# Patient Record
Sex: Female | Born: 1965 | Race: Black or African American | Hispanic: No | Marital: Single | Smoking: Current every day smoker
Health system: Southern US, Community
[De-identification: ages and names within clinical notes are randomized; demographics above are authoritative.]

## PROBLEM LIST (undated history)

## (undated) DIAGNOSIS — E079 Disorder of thyroid, unspecified: Secondary | ICD-10-CM

## (undated) DIAGNOSIS — F329 Major depressive disorder, single episode, unspecified: Secondary | ICD-10-CM

## (undated) DIAGNOSIS — M1990 Unspecified osteoarthritis, unspecified site: Secondary | ICD-10-CM

## (undated) DIAGNOSIS — F32A Depression, unspecified: Secondary | ICD-10-CM

## (undated) DIAGNOSIS — E0500 Thyrotoxicosis with diffuse goiter without thyrotoxic crisis or storm: Secondary | ICD-10-CM

## (undated) DIAGNOSIS — E7800 Pure hypercholesterolemia, unspecified: Secondary | ICD-10-CM

## (undated) DIAGNOSIS — F419 Anxiety disorder, unspecified: Secondary | ICD-10-CM

## (undated) HISTORY — PX: BREAST SURGERY: SHX581

## (undated) HISTORY — PX: BREAST EXCISIONAL BIOPSY: SUR124

---

## 2013-05-29 HISTORY — PX: BREAST BIOPSY: SHX20

## 2016-05-29 HISTORY — PX: REDUCTION MAMMAPLASTY: SUR839

## 2017-04-26 DIAGNOSIS — E118 Type 2 diabetes mellitus with unspecified complications: Secondary | ICD-10-CM | POA: Diagnosis not present

## 2017-04-26 DIAGNOSIS — G894 Chronic pain syndrome: Secondary | ICD-10-CM | POA: Diagnosis not present

## 2017-04-26 DIAGNOSIS — F330 Major depressive disorder, recurrent, mild: Secondary | ICD-10-CM | POA: Diagnosis not present

## 2017-04-26 DIAGNOSIS — M810 Age-related osteoporosis without current pathological fracture: Secondary | ICD-10-CM | POA: Diagnosis not present

## 2017-04-26 DIAGNOSIS — M818 Other osteoporosis without current pathological fracture: Secondary | ICD-10-CM | POA: Diagnosis not present

## 2017-05-10 DIAGNOSIS — M545 Low back pain: Secondary | ICD-10-CM | POA: Diagnosis not present

## 2017-05-31 DIAGNOSIS — M47817 Spondylosis without myelopathy or radiculopathy, lumbosacral region: Secondary | ICD-10-CM | POA: Diagnosis not present

## 2017-05-31 DIAGNOSIS — Z79891 Long term (current) use of opiate analgesic: Secondary | ICD-10-CM | POA: Diagnosis not present

## 2017-05-31 DIAGNOSIS — G894 Chronic pain syndrome: Secondary | ICD-10-CM | POA: Diagnosis not present

## 2017-05-31 DIAGNOSIS — M4316 Spondylolisthesis, lumbar region: Secondary | ICD-10-CM | POA: Diagnosis not present

## 2017-05-31 DIAGNOSIS — Z79899 Other long term (current) drug therapy: Secondary | ICD-10-CM | POA: Diagnosis not present

## 2017-06-07 DIAGNOSIS — H524 Presbyopia: Secondary | ICD-10-CM | POA: Diagnosis not present

## 2017-06-14 DIAGNOSIS — F330 Major depressive disorder, recurrent, mild: Secondary | ICD-10-CM | POA: Diagnosis not present

## 2017-06-14 DIAGNOSIS — M545 Low back pain: Secondary | ICD-10-CM | POA: Diagnosis not present

## 2017-06-14 DIAGNOSIS — E079 Disorder of thyroid, unspecified: Secondary | ICD-10-CM | POA: Diagnosis not present

## 2017-06-14 DIAGNOSIS — M818 Other osteoporosis without current pathological fracture: Secondary | ICD-10-CM | POA: Diagnosis not present

## 2017-06-14 DIAGNOSIS — G894 Chronic pain syndrome: Secondary | ICD-10-CM | POA: Diagnosis not present

## 2017-07-17 DIAGNOSIS — H66001 Acute suppurative otitis media without spontaneous rupture of ear drum, right ear: Secondary | ICD-10-CM | POA: Diagnosis not present

## 2017-07-17 DIAGNOSIS — F329 Major depressive disorder, single episode, unspecified: Secondary | ICD-10-CM | POA: Diagnosis not present

## 2017-07-17 DIAGNOSIS — F17200 Nicotine dependence, unspecified, uncomplicated: Secondary | ICD-10-CM | POA: Diagnosis not present

## 2017-07-17 DIAGNOSIS — F419 Anxiety disorder, unspecified: Secondary | ICD-10-CM | POA: Diagnosis not present

## 2017-07-17 DIAGNOSIS — H9211 Otorrhea, right ear: Secondary | ICD-10-CM | POA: Diagnosis not present

## 2017-09-09 ENCOUNTER — Emergency Department (HOSPITAL_COMMUNITY)
Admission: EM | Admit: 2017-09-09 | Discharge: 2017-09-10 | Disposition: A | Discharge status: Home / Self Care | Payer: Medicare HMO | Attending: Emergency Medicine | Admitting: Emergency Medicine

## 2017-09-09 ENCOUNTER — Encounter (HOSPITAL_COMMUNITY): Payer: Self-pay | Admitting: Emergency Medicine

## 2017-09-09 ENCOUNTER — Emergency Department (HOSPITAL_COMMUNITY): Payer: Medicare HMO

## 2017-09-09 DIAGNOSIS — R0602 Shortness of breath: Secondary | ICD-10-CM | POA: Diagnosis not present

## 2017-09-09 DIAGNOSIS — E079 Disorder of thyroid, unspecified: Secondary | ICD-10-CM | POA: Insufficient documentation

## 2017-09-09 DIAGNOSIS — F17210 Nicotine dependence, cigarettes, uncomplicated: Secondary | ICD-10-CM | POA: Diagnosis not present

## 2017-09-09 DIAGNOSIS — R079 Chest pain, unspecified: Secondary | ICD-10-CM | POA: Insufficient documentation

## 2017-09-09 DIAGNOSIS — R072 Precordial pain: Secondary | ICD-10-CM | POA: Diagnosis not present

## 2017-09-09 HISTORY — DX: Disorder of thyroid, unspecified: E07.9

## 2017-09-09 HISTORY — DX: Pure hypercholesterolemia, unspecified: E78.00

## 2017-09-09 HISTORY — DX: Thyrotoxicosis with diffuse goiter without thyrotoxic crisis or storm: E05.00

## 2017-09-09 HISTORY — DX: Unspecified osteoarthritis, unspecified site: M19.90

## 2017-09-09 LAB — CBC
HCT: 42.6 % (ref 36.0–46.0)
HEMOGLOBIN: 13.9 g/dL (ref 12.0–15.0)
MCH: 28.7 pg (ref 26.0–34.0)
MCHC: 32.6 g/dL (ref 30.0–36.0)
MCV: 88 fL (ref 78.0–100.0)
Platelets: 207 10*3/uL (ref 150–400)
RBC: 4.84 MIL/uL (ref 3.87–5.11)
RDW: 13.9 % (ref 11.5–15.5)
WBC: 9.9 10*3/uL (ref 4.0–10.5)

## 2017-09-09 LAB — BASIC METABOLIC PANEL
ANION GAP: 8 (ref 5–15)
BUN: 10 mg/dL (ref 6–20)
CALCIUM: 9 mg/dL (ref 8.9–10.3)
CO2: 27 mmol/L (ref 22–32)
CREATININE: 0.76 mg/dL (ref 0.44–1.00)
Chloride: 106 mmol/L (ref 101–111)
GFR calc Af Amer: >60 mL/min (ref >60)
GLUCOSE: 113 mg/dL — AB (ref 65–99)
Potassium: 3.6 mmol/L (ref 3.5–5.1)
Sodium: 141 mmol/L (ref 135–145)

## 2017-09-09 LAB — I-STAT TROPONIN, ED: TROPONIN I, POC: 0 ng/mL (ref 0.00–0.08)

## 2017-09-09 LAB — I-STAT BETA HCG BLOOD, ED (MC, WL, AP ONLY): I-stat hCG, quantitative: <5 m[IU]/mL (ref <5)

## 2017-09-09 NOTE — ED Triage Notes (Signed)
Patient presents to ED for assessment of intermittent chest, jaw, neck and back pain x 2 months.  Patient under a lot of stress due to the death of her son in July last year.  Patient states dizziness with standing, SOB with exertion, denies nausea.

## 2017-09-10 ENCOUNTER — Emergency Department (HOSPITAL_COMMUNITY): Payer: Medicare HMO

## 2017-09-10 DIAGNOSIS — R0602 Shortness of breath: Secondary | ICD-10-CM | POA: Diagnosis not present

## 2017-09-10 DIAGNOSIS — R079 Chest pain, unspecified: Secondary | ICD-10-CM | POA: Diagnosis not present

## 2017-09-10 MED ORDER — LORAZEPAM 2 MG/ML IJ SOLN
1.0000 mg | Freq: Once | INTRAMUSCULAR | Status: AC
  Administered 2017-09-10: 1 mg via INTRAVENOUS
  Filled 2017-09-10: qty 1

## 2017-09-10 MED ORDER — IOPAMIDOL (ISOVUE-370) INJECTION 76%
INTRAVENOUS | Status: AC
  Filled 2017-09-10: qty 100

## 2017-09-10 MED ORDER — IOPAMIDOL (ISOVUE-370) INJECTION 76%
100.0000 mL | Freq: Once | INTRAVENOUS | Status: AC | PRN
  Administered 2017-09-10: 100 mL via INTRAVENOUS

## 2017-09-10 NOTE — Discharge Instructions (Addendum)
Follow-up with cardiology to discuss a stress test.  [facility] has been provided in this discharge summary for you to call and make these arrangements.  Return to the emergency department in the meantime if your symptoms significantly worsen or change.

## 2017-09-10 NOTE — ED Provider Notes (Signed)
[facility] EMERGENCY DEPARTMENT Provider Note   CSN: [id number] Arrival date & time: 09/09/17  2253     History   Chief Complaint Chief Complaint  Patient presents with  . Chest Pain    HPI Maria Gray is a 52 y.o. female.  Patient is a 52 year old female with past medical history of Graves' disease and hypercholesterolemia.  She presents today for evaluation of chest discomfort.  This is been ongoing since the end of February.  She reports intermittent pains throughout her chest that are worse with breathing.  She denies any nausea, diaphoresis, or radiation to the arm or jaw.  She denies any exertional symptoms.  She does report that her son was murdered last summer and this has been extremely stressful.  Patient denies any prior cardiac history.  She quit smoking right about the time of onset of her chest symptoms.  The history is provided by the patient.  Chest Pain   This is a new problem. Episode onset: the end of February. Episode frequency: Intermittent. The problem has been gradually worsening. The pain is present in the substernal region and lateral region. The pain is moderate. The quality of the pain is described as sharp and stabbing. The pain does not radiate. Pertinent negatives include no cough, no numbness, no palpitations and no shortness of breath. She has tried nothing for the symptoms.    Past Medical History:  Diagnosis Date  . Arthritis   . Graves disease   . Hypercholesteremia   . Thyroid disease    low    There are no active problems to display for this patient.      OB History   None      Home Medications    Prior to Admission medications   Not on File    Family History No family history on file.  Social History Social History   Tobacco Use  . Smoking status: Current Every Day Smoker    Packs/day: 0.50  . Smokeless tobacco: Never Used  Substance Use Topics  . Alcohol use: Yes    Comment: very rare    . Drug use: Yes    Types: Marijuana     Allergies   Patient has no allergy information on record.   Review of Systems Review of Systems  Respiratory: Negative for cough and shortness of breath.   Cardiovascular: Positive for chest pain. Negative for palpitations.  Neurological: Negative for numbness.  All other systems reviewed and are negative.    Physical Exam Updated Vital Signs BP 124/80 (BP Location: Left Arm)   Pulse 91   Temp 98.1 F (36.7 C)   Resp 18   Ht 5\' 2"  (1.575 m)   Wt 56.7 kg (125 lb)   SpO2 98%   BMI 22.86 kg/m   Physical Exam  Constitutional: She is oriented to person, place, and time. She appears well-developed and well-nourished. No distress.  HENT:  Head: Normocephalic and atraumatic.  Neck: Normal range of motion. Neck supple.  Cardiovascular: Normal rate and regular rhythm. Exam reveals no gallop and no friction rub.  No murmur heard. Pulmonary/Chest: Effort normal and breath sounds normal. No respiratory distress. She has no wheezes.  Abdominal: Soft. Bowel sounds are normal. She exhibits no distension. There is no tenderness.  Musculoskeletal: Normal range of motion.       Right lower leg: Normal. She exhibits no tenderness and no edema.       Left lower leg: Normal. She  exhibits no tenderness and no edema.  Neurological: She is alert and oriented to person, place, and time.  Skin: Skin is warm and dry. She is not diaphoretic.  Nursing note and vitals reviewed.    ED Treatments / Results  Labs (all labs ordered are listed, but only abnormal results are displayed) Labs Reviewed  BASIC METABOLIC PANEL - Abnormal; Notable for the following components:      Result Value   Glucose, Bld 113 (*)    All other components within normal limits  CBC  I-STAT TROPONIN, ED  I-STAT BETA HCG BLOOD, ED ([facility], [facility], [facility] ONLY)    EKG None  Radiology Dg Chest 2 View  Result Date: 09/09/2017 CLINICAL DATA:  Pain across the breast EXAM: CHEST - 2  VIEW COMPARISON:  None. FINDINGS: The heart size and mediastinal contours are within normal limits. Both lungs are clear. The visualized skeletal structures are unremarkable. IMPRESSION: No active cardiopulmonary disease. Electronically Signed   By: Jasmine PangKim  Fujinaga M.D.   On: 09/09/2017 23:21    Procedures Procedures (including critical care time)  Medications Ordered in ED Medications  LORazepam (ATIVAN) injection 1 mg (has no administration in time range)     Initial Impression / Assessment and Plan / ED Course  I have reviewed the triage vital signs and the nursing notes.  Pertinent labs & imaging results that were available during my care of the patient were reviewed by me and considered in my medical decision making (see chart for details).  Patient presents with complaints of chest discomfort that has been migrating throughout her chest and occurring intermittently for the past several months.  Her workup today reveals no evidence for a cardiac etiology or other intrathoracic abnormality.  She reports a great deal of stress recently due to the recent death of her son and I suspect there is an anxiety/stress component.  She is feeling better after receiving Ativan.  At this point, I feel as though she is appropriate for discharge.  She has never had a stress test, so she will be referred to cardiology to discuss this possibility.  She understands to return in the meantime if symptoms worsen or change.  Final Clinical Impressions(s) / ED Diagnoses   Final diagnoses:  None    ED Discharge Orders    None       Geoffery Lyonselo, Landrum Carbonell, MD 09/10/17 [phone]

## 2017-09-20 DIAGNOSIS — E118 Type 2 diabetes mellitus with unspecified complications: Secondary | ICD-10-CM | POA: Diagnosis not present

## 2017-09-20 DIAGNOSIS — G894 Chronic pain syndrome: Secondary | ICD-10-CM | POA: Diagnosis not present

## 2017-09-20 DIAGNOSIS — E079 Disorder of thyroid, unspecified: Secondary | ICD-10-CM | POA: Diagnosis not present

## 2017-09-20 DIAGNOSIS — Z7722 Contact with and (suspected) exposure to environmental tobacco smoke (acute) (chronic): Secondary | ICD-10-CM | POA: Diagnosis not present

## 2017-09-20 DIAGNOSIS — M818 Other osteoporosis without current pathological fracture: Secondary | ICD-10-CM | POA: Diagnosis not present

## 2017-09-20 DIAGNOSIS — I10 Essential (primary) hypertension: Secondary | ICD-10-CM | POA: Diagnosis not present

## 2017-09-20 DIAGNOSIS — M545 Low back pain: Secondary | ICD-10-CM | POA: Diagnosis not present

## 2017-09-20 DIAGNOSIS — F330 Major depressive disorder, recurrent, mild: Secondary | ICD-10-CM | POA: Diagnosis not present

## 2017-10-15 ENCOUNTER — Ambulatory Visit (HOSPITAL_COMMUNITY)
Admission: EM | Admit: 2017-10-15 | Discharge: 2017-10-15 | Disposition: A | Discharge status: Home / Self Care | Payer: Medicare HMO | Attending: Internal Medicine | Admitting: Internal Medicine

## 2017-10-15 ENCOUNTER — Encounter (HOSPITAL_COMMUNITY): Payer: Self-pay | Admitting: Emergency Medicine

## 2017-10-15 ENCOUNTER — Other Ambulatory Visit: Payer: Self-pay

## 2017-10-15 DIAGNOSIS — Z79899 Other long term (current) drug therapy: Secondary | ICD-10-CM | POA: Diagnosis not present

## 2017-10-15 DIAGNOSIS — Z791 Long term (current) use of non-steroidal anti-inflammatories (NSAID): Secondary | ICD-10-CM | POA: Diagnosis not present

## 2017-10-15 DIAGNOSIS — M1990 Unspecified osteoarthritis, unspecified site: Secondary | ICD-10-CM | POA: Diagnosis not present

## 2017-10-15 DIAGNOSIS — F17210 Nicotine dependence, cigarettes, uncomplicated: Secondary | ICD-10-CM | POA: Insufficient documentation

## 2017-10-15 DIAGNOSIS — E0500 Thyrotoxicosis with diffuse goiter without thyrotoxic crisis or storm: Secondary | ICD-10-CM | POA: Insufficient documentation

## 2017-10-15 DIAGNOSIS — F419 Anxiety disorder, unspecified: Secondary | ICD-10-CM | POA: Insufficient documentation

## 2017-10-15 DIAGNOSIS — N3000 Acute cystitis without hematuria: Secondary | ICD-10-CM | POA: Insufficient documentation

## 2017-10-15 DIAGNOSIS — F329 Major depressive disorder, single episode, unspecified: Secondary | ICD-10-CM | POA: Insufficient documentation

## 2017-10-15 DIAGNOSIS — E7800 Pure hypercholesterolemia, unspecified: Secondary | ICD-10-CM | POA: Diagnosis not present

## 2017-10-15 DIAGNOSIS — Z87440 Personal history of urinary (tract) infections: Secondary | ICD-10-CM | POA: Insufficient documentation

## 2017-10-15 DIAGNOSIS — Z79890 Hormone replacement therapy (postmenopausal): Secondary | ICD-10-CM | POA: Diagnosis not present

## 2017-10-15 DIAGNOSIS — R350 Frequency of micturition: Secondary | ICD-10-CM | POA: Diagnosis not present

## 2017-10-15 DIAGNOSIS — Z8249 Family history of ischemic heart disease and other diseases of the circulatory system: Secondary | ICD-10-CM | POA: Insufficient documentation

## 2017-10-15 HISTORY — DX: Anxiety disorder, unspecified: F41.9

## 2017-10-15 HISTORY — DX: Depression, unspecified: F32.A

## 2017-10-15 HISTORY — DX: Major depressive disorder, single episode, unspecified: F32.9

## 2017-10-15 LAB — POCT URINALYSIS DIP (DEVICE)
Bilirubin Urine: NEGATIVE
GLUCOSE, UA: NEGATIVE mg/dL
Nitrite: POSITIVE — AB
Protein, ur: 100 mg/dL — AB
Urobilinogen, UA: 0.2 mg/dL (ref 0.0–1.0)
pH: 5 (ref 5.0–8.0)

## 2017-10-15 MED ORDER — NITROFURANTOIN MONOHYD MACRO 100 MG PO CAPS: 100.0000 mg | ORAL_CAPSULE | Freq: Two times a day (BID) | ORAL | 0 refills | Status: AC

## 2017-10-15 NOTE — ED Provider Notes (Signed)
[facility]    CSN: [id number] Arrival date & time: 10/15/17  1407     History   Chief Complaint Chief Complaint  Patient presents with  . Recurrent UTI    HPI Maria Gray is a 52 y.o. female.   Maria Gray presents with complaints of urinary frequency as well as "tingling" sensation after urination, odor to urine, which started 4 days ago. States feels similar to previous UTI's she has had in the past but it has been years. No abdominal pain, new back pain, fevers, vaginal symptoms, blood in urine. Has not taken any medications for symptoms. Post menopausal. Hx of thyroid disease, arthritis, anxiety, depression.    ROS per HPI.      Past Medical History:  Diagnosis Date  . Anxiety   . Arthritis   . Depression   . Graves disease   . Graves disease   . Hypercholesteremia   . Thyroid disease    low    There are no active problems to display for this patient.   Past Surgical History:  Procedure Laterality Date  . BREAST SURGERY      OB History   None      Home Medications    Prior to Admission medications   Medication Sig Start Date End Date Taking? Authorizing Provider  HYDROcodone-acetaminophen (NORCO/VICODIN) 5-325 MG tablet Take 1 tablet by mouth every 6 (six) hours as needed for moderate pain.   Yes [provider]  levothyroxine (SYNTHROID, LEVOTHROID) 150 MCG tablet Take 150 mcg by mouth daily before breakfast.   Yes [provider]  meloxicam (MOBIC) 15 MG tablet Take 15 mg by mouth daily.   Yes [provider]  Rosuvastatin Calcium (CRESTOR PO) Take by mouth.   Yes [provider]  Varenicline Tartrate (CHANTIX PO) Take by mouth.   Yes [provider]  nitrofurantoin, macrocrystal-monohydrate, (MACROBID) 100 MG capsule Take 1 capsule (100 mg total) by mouth 2 (two) times daily for 5 days. 10/15/17 10/20/17  Georgetta Haber, NP    Family History Family History  Problem Relation Age  of Onset  . Diabetes Mother   . Hypertension Mother   . HIV Mother     Social History Social History   Tobacco Use  . Smoking status: Current Every Day Smoker    Packs/day: 0.50  . Smokeless tobacco: Never Used  Substance Use Topics  . Alcohol use: Yes    Comment: very rare  . Drug use: Yes    Types: Marijuana     Allergies   Patient has no known allergies.   Review of Systems Review of Systems   Physical Exam Triage Vital Signs ED Triage Vitals  Enc Vitals Group     BP 10/15/17 1545 113/71     Pulse Rate 10/15/17 1545 80     Resp 10/15/17 1545 18     Temp 10/15/17 1545 97.8 F (36.6 C)     Temp Source 10/15/17 1545 Oral     SpO2 10/15/17 1545 100 %     Weight --      Height --      Head Circumference --      Peak Flow --      Pain Score 10/15/17 1541 0     Pain Loc --      Pain Edu? --      Excl. in GC? --    No data found.  Updated Vital Signs BP 113/71 (BP Location: Left Arm)  Pulse 80   Temp 97.8 F (36.6 C) (Oral)   Resp 18   SpO2 100%    Physical Exam  Constitutional: She is oriented to person, place, and time. She appears well-developed and well-nourished. No distress.  Cardiovascular: Normal rate, regular rhythm and normal heart sounds.  Pulmonary/Chest: Effort normal and breath sounds normal.  Abdominal: Soft. Bowel sounds are normal. She exhibits no distension. There is no tenderness. There is no rigidity, no rebound, no guarding and no CVA tenderness.  Neurological: She is alert and oriented to person, place, and time.  Skin: Skin is warm and dry.     UC Treatments / Results  Labs (all labs ordered are listed, but only abnormal results are displayed) Labs Reviewed  POCT URINALYSIS DIP (DEVICE) - Abnormal; Notable for the following components:      Result Value   Ketones, ur TRACE (*)    Hgb urine dipstick LARGE (*)    Protein, ur 100 (*)    Nitrite POSITIVE (*)    Leukocytes, UA SMALL (*)    All other components within  normal limits  URINE CULTURE    EKG None  Radiology No results found.  Procedures Procedures (including critical care time)  Medications Ordered in UC Medications - No data to display  Initial Impression / Assessment and Plan / UC Course  I have reviewed the triage vital signs and the nursing notes.  Pertinent labs & imaging results that were available during my care of the patient were reviewed by me and considered in my medical decision making (see chart for details).     Urine results and history consistent with UTI. Course of macrobid initiated. Urine culture sent to confirm sensitivity. Return precautions provided. Patient verbalized understanding and agreeable to plan.    Final Clinical Impressions(s) / UC Diagnoses   Final diagnoses:  Acute cystitis without hematuria     Discharge Instructions     Increase fluid intake to empty bladder regularly.  Complete course of antibiotics.   If symptoms worsen or do not improve in the next week to return to be seen or to follow up with your PCP.      ED Prescriptions    Medication Sig Dispense Auth. Provider   nitrofurantoin, macrocrystal-monohydrate, (MACROBID) 100 MG capsule Take 1 capsule (100 mg total) by mouth 2 (two) times daily for 5 days. 10 capsule Georgetta Haber, NP     Controlled Substance Prescriptions [place] Controlled Substance Registry consulted? Not Applicable   Georgetta Haber, NP 10/15/17 1616

## 2017-10-15 NOTE — Discharge Instructions (Signed)
Increase fluid intake to empty bladder regularly.  Complete course of antibiotics.   If symptoms worsen or do not improve in the next week to return to be seen or to follow up with your PCP.

## 2017-10-15 NOTE — ED Triage Notes (Signed)
Onset of symptoms 4 days ago.  Pain with urination, cloudy urine

## 2017-10-18 LAB — URINE CULTURE: Culture: >=100000 — AB

## 2017-10-25 DIAGNOSIS — M47817 Spondylosis without myelopathy or radiculopathy, lumbosacral region: Secondary | ICD-10-CM | POA: Diagnosis not present

## 2017-10-25 DIAGNOSIS — Q762 Congenital spondylolisthesis: Secondary | ICD-10-CM | POA: Diagnosis not present

## 2017-10-30 DIAGNOSIS — M5136 Other intervertebral disc degeneration, lumbar region: Secondary | ICD-10-CM | POA: Diagnosis not present

## 2017-10-30 DIAGNOSIS — M4310 Spondylolisthesis, site unspecified: Secondary | ICD-10-CM | POA: Diagnosis not present

## 2017-10-30 DIAGNOSIS — Z79899 Other long term (current) drug therapy: Secondary | ICD-10-CM | POA: Diagnosis not present

## 2017-10-30 DIAGNOSIS — F329 Major depressive disorder, single episode, unspecified: Secondary | ICD-10-CM | POA: Diagnosis not present

## 2017-11-30 DIAGNOSIS — Z79899 Other long term (current) drug therapy: Secondary | ICD-10-CM | POA: Diagnosis not present

## 2017-11-30 DIAGNOSIS — G8929 Other chronic pain: Secondary | ICD-10-CM | POA: Diagnosis not present

## 2017-11-30 DIAGNOSIS — M545 Low back pain: Secondary | ICD-10-CM | POA: Diagnosis not present

## 2017-11-30 DIAGNOSIS — Z9109 Other allergy status, other than to drugs and biological substances: Secondary | ICD-10-CM | POA: Diagnosis not present

## 2017-12-06 DIAGNOSIS — F4321 Adjustment disorder with depressed mood: Secondary | ICD-10-CM | POA: Diagnosis not present

## 2017-12-06 DIAGNOSIS — F329 Major depressive disorder, single episode, unspecified: Secondary | ICD-10-CM | POA: Diagnosis not present

## 2017-12-06 DIAGNOSIS — Z634 Disappearance and death of family member: Secondary | ICD-10-CM | POA: Diagnosis not present

## 2017-12-06 DIAGNOSIS — F419 Anxiety disorder, unspecified: Secondary | ICD-10-CM | POA: Diagnosis not present

## 2017-12-13 DIAGNOSIS — M4317 Spondylolisthesis, lumbosacral region: Secondary | ICD-10-CM | POA: Diagnosis not present

## 2017-12-13 DIAGNOSIS — R350 Frequency of micturition: Secondary | ICD-10-CM | POA: Diagnosis not present

## 2017-12-13 DIAGNOSIS — E7800 Pure hypercholesterolemia, unspecified: Secondary | ICD-10-CM | POA: Diagnosis not present

## 2017-12-13 DIAGNOSIS — Z0000 Encounter for general adult medical examination without abnormal findings: Secondary | ICD-10-CM | POA: Diagnosis not present

## 2017-12-13 DIAGNOSIS — R5383 Other fatigue: Secondary | ICD-10-CM | POA: Diagnosis not present

## 2017-12-13 DIAGNOSIS — E559 Vitamin D deficiency, unspecified: Secondary | ICD-10-CM | POA: Diagnosis not present

## 2017-12-13 DIAGNOSIS — R8290 Unspecified abnormal findings in urine: Secondary | ICD-10-CM | POA: Diagnosis not present

## 2017-12-28 DIAGNOSIS — N390 Urinary tract infection, site not specified: Secondary | ICD-10-CM | POA: Diagnosis not present

## 2017-12-28 DIAGNOSIS — R630 Anorexia: Secondary | ICD-10-CM | POA: Diagnosis not present

## 2017-12-28 DIAGNOSIS — E039 Hypothyroidism, unspecified: Secondary | ICD-10-CM | POA: Diagnosis not present

## 2017-12-28 DIAGNOSIS — E559 Vitamin D deficiency, unspecified: Secondary | ICD-10-CM | POA: Diagnosis not present

## 2018-01-03 DIAGNOSIS — M545 Low back pain: Secondary | ICD-10-CM | POA: Diagnosis not present

## 2018-01-03 DIAGNOSIS — G8929 Other chronic pain: Secondary | ICD-10-CM | POA: Diagnosis not present

## 2018-01-03 DIAGNOSIS — G894 Chronic pain syndrome: Secondary | ICD-10-CM | POA: Diagnosis not present

## 2018-01-03 DIAGNOSIS — Z79899 Other long term (current) drug therapy: Secondary | ICD-10-CM | POA: Diagnosis not present

## 2018-01-03 DIAGNOSIS — Q762 Congenital spondylolisthesis: Secondary | ICD-10-CM | POA: Diagnosis not present

## 2018-01-04 DIAGNOSIS — M5431 Sciatica, right side: Secondary | ICD-10-CM | POA: Diagnosis not present

## 2018-01-04 DIAGNOSIS — M5432 Sciatica, left side: Secondary | ICD-10-CM | POA: Diagnosis not present

## 2018-01-04 DIAGNOSIS — M4726 Other spondylosis with radiculopathy, lumbar region: Secondary | ICD-10-CM | POA: Diagnosis not present

## 2018-01-04 DIAGNOSIS — Q762 Congenital spondylolisthesis: Secondary | ICD-10-CM | POA: Diagnosis not present

## 2018-01-17 DIAGNOSIS — G4700 Insomnia, unspecified: Secondary | ICD-10-CM | POA: Diagnosis not present

## 2018-01-17 DIAGNOSIS — F329 Major depressive disorder, single episode, unspecified: Secondary | ICD-10-CM | POA: Diagnosis not present

## 2018-01-17 DIAGNOSIS — F319 Bipolar disorder, unspecified: Secondary | ICD-10-CM | POA: Diagnosis not present

## 2018-01-17 DIAGNOSIS — F419 Anxiety disorder, unspecified: Secondary | ICD-10-CM | POA: Diagnosis not present

## 2018-02-01 DIAGNOSIS — Q762 Congenital spondylolisthesis: Secondary | ICD-10-CM | POA: Diagnosis not present

## 2018-02-01 DIAGNOSIS — Z79899 Other long term (current) drug therapy: Secondary | ICD-10-CM | POA: Diagnosis not present

## 2018-02-01 DIAGNOSIS — G894 Chronic pain syndrome: Secondary | ICD-10-CM | POA: Diagnosis not present

## 2018-02-01 DIAGNOSIS — F1120 Opioid dependence, uncomplicated: Secondary | ICD-10-CM | POA: Diagnosis not present

## 2018-02-08 DIAGNOSIS — M542 Cervicalgia: Secondary | ICD-10-CM | POA: Diagnosis not present

## 2018-02-08 DIAGNOSIS — G8929 Other chronic pain: Secondary | ICD-10-CM | POA: Diagnosis not present

## 2018-02-08 DIAGNOSIS — M47812 Spondylosis without myelopathy or radiculopathy, cervical region: Secondary | ICD-10-CM | POA: Diagnosis not present

## 2018-03-07 DIAGNOSIS — M4317 Spondylolisthesis, lumbosacral region: Secondary | ICD-10-CM | POA: Diagnosis not present

## 2018-03-08 DIAGNOSIS — G894 Chronic pain syndrome: Secondary | ICD-10-CM | POA: Diagnosis not present

## 2018-03-08 DIAGNOSIS — Z79899 Other long term (current) drug therapy: Secondary | ICD-10-CM | POA: Diagnosis not present

## 2018-03-08 DIAGNOSIS — Q762 Congenital spondylolisthesis: Secondary | ICD-10-CM | POA: Diagnosis not present

## 2018-03-08 DIAGNOSIS — F1120 Opioid dependence, uncomplicated: Secondary | ICD-10-CM | POA: Diagnosis not present

## 2018-04-04 DIAGNOSIS — Z79899 Other long term (current) drug therapy: Secondary | ICD-10-CM | POA: Diagnosis not present

## 2018-04-04 DIAGNOSIS — M545 Low back pain: Secondary | ICD-10-CM | POA: Diagnosis not present

## 2018-04-04 DIAGNOSIS — Q762 Congenital spondylolisthesis: Secondary | ICD-10-CM | POA: Diagnosis not present

## 2018-04-04 DIAGNOSIS — G894 Chronic pain syndrome: Secondary | ICD-10-CM | POA: Diagnosis not present

## 2018-04-19 DIAGNOSIS — Z79899 Other long term (current) drug therapy: Secondary | ICD-10-CM | POA: Diagnosis not present

## 2018-04-19 DIAGNOSIS — E7800 Pure hypercholesterolemia, unspecified: Secondary | ICD-10-CM | POA: Diagnosis not present

## 2018-04-19 DIAGNOSIS — E559 Vitamin D deficiency, unspecified: Secondary | ICD-10-CM | POA: Diagnosis not present

## 2018-04-19 DIAGNOSIS — E039 Hypothyroidism, unspecified: Secondary | ICD-10-CM | POA: Diagnosis not present

## 2018-04-19 DIAGNOSIS — R5383 Other fatigue: Secondary | ICD-10-CM | POA: Diagnosis not present

## 2018-05-02 DIAGNOSIS — E785 Hyperlipidemia, unspecified: Secondary | ICD-10-CM | POA: Diagnosis not present

## 2018-05-02 DIAGNOSIS — E559 Vitamin D deficiency, unspecified: Secondary | ICD-10-CM | POA: Diagnosis not present

## 2018-05-02 DIAGNOSIS — E039 Hypothyroidism, unspecified: Secondary | ICD-10-CM | POA: Diagnosis not present

## 2018-05-06 DIAGNOSIS — Z79899 Other long term (current) drug therapy: Secondary | ICD-10-CM | POA: Diagnosis not present

## 2018-05-06 DIAGNOSIS — Q762 Congenital spondylolisthesis: Secondary | ICD-10-CM | POA: Diagnosis not present

## 2018-05-06 DIAGNOSIS — G894 Chronic pain syndrome: Secondary | ICD-10-CM | POA: Diagnosis not present

## 2018-05-10 DIAGNOSIS — M4317 Spondylolisthesis, lumbosacral region: Secondary | ICD-10-CM | POA: Diagnosis not present

## 2018-05-10 DIAGNOSIS — F419 Anxiety disorder, unspecified: Secondary | ICD-10-CM | POA: Diagnosis not present

## 2018-05-10 DIAGNOSIS — F329 Major depressive disorder, single episode, unspecified: Secondary | ICD-10-CM | POA: Diagnosis not present

## 2018-05-10 DIAGNOSIS — F17200 Nicotine dependence, unspecified, uncomplicated: Secondary | ICD-10-CM | POA: Diagnosis not present

## 2018-05-10 DIAGNOSIS — E079 Disorder of thyroid, unspecified: Secondary | ICD-10-CM | POA: Diagnosis not present

## 2018-05-17 DIAGNOSIS — M48062 Spinal stenosis, lumbar region with neurogenic claudication: Secondary | ICD-10-CM | POA: Diagnosis not present

## 2018-05-17 DIAGNOSIS — F419 Anxiety disorder, unspecified: Secondary | ICD-10-CM | POA: Diagnosis not present

## 2018-05-17 DIAGNOSIS — M532X7 Spinal instabilities, lumbosacral region: Secondary | ICD-10-CM | POA: Diagnosis not present

## 2018-05-17 DIAGNOSIS — M4317 Spondylolisthesis, lumbosacral region: Secondary | ICD-10-CM | POA: Diagnosis not present

## 2018-05-17 DIAGNOSIS — F17200 Nicotine dependence, unspecified, uncomplicated: Secondary | ICD-10-CM | POA: Diagnosis not present

## 2018-05-17 DIAGNOSIS — E079 Disorder of thyroid, unspecified: Secondary | ICD-10-CM | POA: Diagnosis not present

## 2018-05-17 DIAGNOSIS — F329 Major depressive disorder, single episode, unspecified: Secondary | ICD-10-CM | POA: Diagnosis not present

## 2018-05-21 DIAGNOSIS — E039 Hypothyroidism, unspecified: Secondary | ICD-10-CM | POA: Diagnosis not present

## 2018-05-21 DIAGNOSIS — F17210 Nicotine dependence, cigarettes, uncomplicated: Secondary | ICD-10-CM | POA: Diagnosis not present

## 2018-05-21 DIAGNOSIS — M4317 Spondylolisthesis, lumbosacral region: Secondary | ICD-10-CM | POA: Diagnosis not present

## 2018-05-21 DIAGNOSIS — Z4789 Encounter for other orthopedic aftercare: Secondary | ICD-10-CM | POA: Diagnosis not present

## 2018-05-21 DIAGNOSIS — F329 Major depressive disorder, single episode, unspecified: Secondary | ICD-10-CM | POA: Diagnosis not present

## 2018-05-21 DIAGNOSIS — F419 Anxiety disorder, unspecified: Secondary | ICD-10-CM | POA: Diagnosis not present

## 2018-05-24 DIAGNOSIS — Z4789 Encounter for other orthopedic aftercare: Secondary | ICD-10-CM | POA: Diagnosis not present

## 2018-05-24 DIAGNOSIS — F17210 Nicotine dependence, cigarettes, uncomplicated: Secondary | ICD-10-CM | POA: Diagnosis not present

## 2018-05-24 DIAGNOSIS — E039 Hypothyroidism, unspecified: Secondary | ICD-10-CM | POA: Diagnosis not present

## 2018-05-24 DIAGNOSIS — M4317 Spondylolisthesis, lumbosacral region: Secondary | ICD-10-CM | POA: Diagnosis not present

## 2018-05-24 DIAGNOSIS — F419 Anxiety disorder, unspecified: Secondary | ICD-10-CM | POA: Diagnosis not present

## 2018-05-24 DIAGNOSIS — F329 Major depressive disorder, single episode, unspecified: Secondary | ICD-10-CM | POA: Diagnosis not present

## 2018-05-27 DIAGNOSIS — M4317 Spondylolisthesis, lumbosacral region: Secondary | ICD-10-CM | POA: Diagnosis not present

## 2018-05-27 DIAGNOSIS — F17210 Nicotine dependence, cigarettes, uncomplicated: Secondary | ICD-10-CM | POA: Diagnosis not present

## 2018-05-27 DIAGNOSIS — F329 Major depressive disorder, single episode, unspecified: Secondary | ICD-10-CM | POA: Diagnosis not present

## 2018-05-27 DIAGNOSIS — E039 Hypothyroidism, unspecified: Secondary | ICD-10-CM | POA: Diagnosis not present

## 2018-05-27 DIAGNOSIS — F419 Anxiety disorder, unspecified: Secondary | ICD-10-CM | POA: Diagnosis not present

## 2018-05-27 DIAGNOSIS — Z4789 Encounter for other orthopedic aftercare: Secondary | ICD-10-CM | POA: Diagnosis not present

## 2018-05-29 DIAGNOSIS — F419 Anxiety disorder, unspecified: Secondary | ICD-10-CM | POA: Diagnosis not present

## 2018-05-29 DIAGNOSIS — F329 Major depressive disorder, single episode, unspecified: Secondary | ICD-10-CM | POA: Diagnosis not present

## 2018-05-29 DIAGNOSIS — F17210 Nicotine dependence, cigarettes, uncomplicated: Secondary | ICD-10-CM | POA: Diagnosis not present

## 2018-05-29 DIAGNOSIS — E039 Hypothyroidism, unspecified: Secondary | ICD-10-CM | POA: Diagnosis not present

## 2018-05-29 DIAGNOSIS — Z4789 Encounter for other orthopedic aftercare: Secondary | ICD-10-CM | POA: Diagnosis not present

## 2018-05-29 DIAGNOSIS — M4317 Spondylolisthesis, lumbosacral region: Secondary | ICD-10-CM | POA: Diagnosis not present

## 2018-05-30 DIAGNOSIS — M4317 Spondylolisthesis, lumbosacral region: Secondary | ICD-10-CM | POA: Diagnosis not present

## 2018-05-30 DIAGNOSIS — F329 Major depressive disorder, single episode, unspecified: Secondary | ICD-10-CM | POA: Diagnosis not present

## 2018-05-30 DIAGNOSIS — E039 Hypothyroidism, unspecified: Secondary | ICD-10-CM | POA: Diagnosis not present

## 2018-05-30 DIAGNOSIS — F17210 Nicotine dependence, cigarettes, uncomplicated: Secondary | ICD-10-CM | POA: Diagnosis not present

## 2018-05-30 DIAGNOSIS — Z4789 Encounter for other orthopedic aftercare: Secondary | ICD-10-CM | POA: Diagnosis not present

## 2018-05-30 DIAGNOSIS — F419 Anxiety disorder, unspecified: Secondary | ICD-10-CM | POA: Diagnosis not present

## 2018-05-31 DIAGNOSIS — Z4789 Encounter for other orthopedic aftercare: Secondary | ICD-10-CM | POA: Diagnosis not present

## 2018-05-31 DIAGNOSIS — F329 Major depressive disorder, single episode, unspecified: Secondary | ICD-10-CM | POA: Diagnosis not present

## 2018-05-31 DIAGNOSIS — M4317 Spondylolisthesis, lumbosacral region: Secondary | ICD-10-CM | POA: Diagnosis not present

## 2018-05-31 DIAGNOSIS — F17210 Nicotine dependence, cigarettes, uncomplicated: Secondary | ICD-10-CM | POA: Diagnosis not present

## 2018-05-31 DIAGNOSIS — F419 Anxiety disorder, unspecified: Secondary | ICD-10-CM | POA: Diagnosis not present

## 2018-05-31 DIAGNOSIS — E039 Hypothyroidism, unspecified: Secondary | ICD-10-CM | POA: Diagnosis not present

## 2018-06-04 DIAGNOSIS — M4317 Spondylolisthesis, lumbosacral region: Secondary | ICD-10-CM | POA: Diagnosis not present

## 2018-06-04 DIAGNOSIS — F419 Anxiety disorder, unspecified: Secondary | ICD-10-CM | POA: Diagnosis not present

## 2018-06-04 DIAGNOSIS — Z4789 Encounter for other orthopedic aftercare: Secondary | ICD-10-CM | POA: Diagnosis not present

## 2018-06-04 DIAGNOSIS — E039 Hypothyroidism, unspecified: Secondary | ICD-10-CM | POA: Diagnosis not present

## 2018-06-04 DIAGNOSIS — F17210 Nicotine dependence, cigarettes, uncomplicated: Secondary | ICD-10-CM | POA: Diagnosis not present

## 2018-06-04 DIAGNOSIS — F329 Major depressive disorder, single episode, unspecified: Secondary | ICD-10-CM | POA: Diagnosis not present

## 2018-06-05 DIAGNOSIS — E039 Hypothyroidism, unspecified: Secondary | ICD-10-CM | POA: Diagnosis not present

## 2018-06-05 DIAGNOSIS — F17210 Nicotine dependence, cigarettes, uncomplicated: Secondary | ICD-10-CM | POA: Diagnosis not present

## 2018-06-05 DIAGNOSIS — M4317 Spondylolisthesis, lumbosacral region: Secondary | ICD-10-CM | POA: Diagnosis not present

## 2018-06-05 DIAGNOSIS — Z4789 Encounter for other orthopedic aftercare: Secondary | ICD-10-CM | POA: Diagnosis not present

## 2018-06-05 DIAGNOSIS — F329 Major depressive disorder, single episode, unspecified: Secondary | ICD-10-CM | POA: Diagnosis not present

## 2018-06-05 DIAGNOSIS — F419 Anxiety disorder, unspecified: Secondary | ICD-10-CM | POA: Diagnosis not present

## 2018-06-06 DIAGNOSIS — Z4789 Encounter for other orthopedic aftercare: Secondary | ICD-10-CM | POA: Diagnosis not present

## 2018-06-06 DIAGNOSIS — Z981 Arthrodesis status: Secondary | ICD-10-CM | POA: Diagnosis not present

## 2018-06-07 DIAGNOSIS — M4317 Spondylolisthesis, lumbosacral region: Secondary | ICD-10-CM | POA: Diagnosis not present

## 2018-06-07 DIAGNOSIS — F329 Major depressive disorder, single episode, unspecified: Secondary | ICD-10-CM | POA: Diagnosis not present

## 2018-06-07 DIAGNOSIS — Z4789 Encounter for other orthopedic aftercare: Secondary | ICD-10-CM | POA: Diagnosis not present

## 2018-06-07 DIAGNOSIS — F419 Anxiety disorder, unspecified: Secondary | ICD-10-CM | POA: Diagnosis not present

## 2018-06-07 DIAGNOSIS — F17210 Nicotine dependence, cigarettes, uncomplicated: Secondary | ICD-10-CM | POA: Diagnosis not present

## 2018-06-07 DIAGNOSIS — E039 Hypothyroidism, unspecified: Secondary | ICD-10-CM | POA: Diagnosis not present

## 2018-06-07 DIAGNOSIS — Z79899 Other long term (current) drug therapy: Secondary | ICD-10-CM | POA: Diagnosis not present

## 2018-06-07 DIAGNOSIS — G894 Chronic pain syndrome: Secondary | ICD-10-CM | POA: Diagnosis not present

## 2018-06-07 DIAGNOSIS — Q762 Congenital spondylolisthesis: Secondary | ICD-10-CM | POA: Diagnosis not present

## 2018-06-11 DIAGNOSIS — F419 Anxiety disorder, unspecified: Secondary | ICD-10-CM | POA: Diagnosis not present

## 2018-06-11 DIAGNOSIS — M4317 Spondylolisthesis, lumbosacral region: Secondary | ICD-10-CM | POA: Diagnosis not present

## 2018-06-11 DIAGNOSIS — F329 Major depressive disorder, single episode, unspecified: Secondary | ICD-10-CM | POA: Diagnosis not present

## 2018-06-11 DIAGNOSIS — Z4789 Encounter for other orthopedic aftercare: Secondary | ICD-10-CM | POA: Diagnosis not present

## 2018-06-11 DIAGNOSIS — F17210 Nicotine dependence, cigarettes, uncomplicated: Secondary | ICD-10-CM | POA: Diagnosis not present

## 2018-06-11 DIAGNOSIS — E039 Hypothyroidism, unspecified: Secondary | ICD-10-CM | POA: Diagnosis not present

## 2018-06-12 DIAGNOSIS — F17210 Nicotine dependence, cigarettes, uncomplicated: Secondary | ICD-10-CM | POA: Diagnosis not present

## 2018-06-12 DIAGNOSIS — F329 Major depressive disorder, single episode, unspecified: Secondary | ICD-10-CM | POA: Diagnosis not present

## 2018-06-12 DIAGNOSIS — Z4789 Encounter for other orthopedic aftercare: Secondary | ICD-10-CM | POA: Diagnosis not present

## 2018-06-12 DIAGNOSIS — F419 Anxiety disorder, unspecified: Secondary | ICD-10-CM | POA: Diagnosis not present

## 2018-06-12 DIAGNOSIS — E039 Hypothyroidism, unspecified: Secondary | ICD-10-CM | POA: Diagnosis not present

## 2018-06-12 DIAGNOSIS — M4317 Spondylolisthesis, lumbosacral region: Secondary | ICD-10-CM | POA: Diagnosis not present

## 2018-06-21 DIAGNOSIS — Z4789 Encounter for other orthopedic aftercare: Secondary | ICD-10-CM | POA: Diagnosis not present

## 2018-06-21 DIAGNOSIS — F17210 Nicotine dependence, cigarettes, uncomplicated: Secondary | ICD-10-CM | POA: Diagnosis not present

## 2018-06-21 DIAGNOSIS — F329 Major depressive disorder, single episode, unspecified: Secondary | ICD-10-CM | POA: Diagnosis not present

## 2018-06-21 DIAGNOSIS — M4317 Spondylolisthesis, lumbosacral region: Secondary | ICD-10-CM | POA: Diagnosis not present

## 2018-06-21 DIAGNOSIS — E039 Hypothyroidism, unspecified: Secondary | ICD-10-CM | POA: Diagnosis not present

## 2018-06-21 DIAGNOSIS — F419 Anxiety disorder, unspecified: Secondary | ICD-10-CM | POA: Diagnosis not present

## 2018-06-27 DIAGNOSIS — F329 Major depressive disorder, single episode, unspecified: Secondary | ICD-10-CM | POA: Diagnosis not present

## 2018-06-27 DIAGNOSIS — G894 Chronic pain syndrome: Secondary | ICD-10-CM | POA: Diagnosis not present

## 2018-06-27 DIAGNOSIS — E559 Vitamin D deficiency, unspecified: Secondary | ICD-10-CM | POA: Diagnosis not present

## 2018-06-27 DIAGNOSIS — E7800 Pure hypercholesterolemia, unspecified: Secondary | ICD-10-CM | POA: Diagnosis not present

## 2018-07-05 DIAGNOSIS — Z4789 Encounter for other orthopedic aftercare: Secondary | ICD-10-CM | POA: Diagnosis not present

## 2018-07-05 DIAGNOSIS — Z981 Arthrodesis status: Secondary | ICD-10-CM | POA: Diagnosis not present

## 2018-07-10 DIAGNOSIS — E7800 Pure hypercholesterolemia, unspecified: Secondary | ICD-10-CM | POA: Diagnosis not present

## 2018-07-10 DIAGNOSIS — H5789 Other specified disorders of eye and adnexa: Secondary | ICD-10-CM | POA: Diagnosis not present

## 2018-07-10 DIAGNOSIS — E559 Vitamin D deficiency, unspecified: Secondary | ICD-10-CM | POA: Diagnosis not present

## 2018-07-10 DIAGNOSIS — E039 Hypothyroidism, unspecified: Secondary | ICD-10-CM | POA: Diagnosis not present

## 2018-07-15 DIAGNOSIS — Z789 Other specified health status: Secondary | ICD-10-CM | POA: Diagnosis not present

## 2018-07-15 DIAGNOSIS — E7800 Pure hypercholesterolemia, unspecified: Secondary | ICD-10-CM | POA: Diagnosis not present

## 2018-07-15 DIAGNOSIS — E039 Hypothyroidism, unspecified: Secondary | ICD-10-CM | POA: Diagnosis not present

## 2018-08-12 DIAGNOSIS — E559 Vitamin D deficiency, unspecified: Secondary | ICD-10-CM | POA: Diagnosis not present

## 2018-08-12 DIAGNOSIS — Z789 Other specified health status: Secondary | ICD-10-CM | POA: Diagnosis not present

## 2018-08-12 DIAGNOSIS — E7800 Pure hypercholesterolemia, unspecified: Secondary | ICD-10-CM | POA: Diagnosis not present

## 2018-09-03 DIAGNOSIS — F419 Anxiety disorder, unspecified: Secondary | ICD-10-CM | POA: Diagnosis not present

## 2018-09-03 DIAGNOSIS — Z720 Tobacco use: Secondary | ICD-10-CM | POA: Diagnosis not present

## 2018-09-03 DIAGNOSIS — Z139 Encounter for screening, unspecified: Secondary | ICD-10-CM | POA: Diagnosis not present

## 2018-09-03 DIAGNOSIS — R634 Abnormal weight loss: Secondary | ICD-10-CM | POA: Diagnosis not present

## 2018-09-03 DIAGNOSIS — E890 Postprocedural hypothyroidism: Secondary | ICD-10-CM | POA: Diagnosis not present

## 2018-09-03 DIAGNOSIS — R197 Diarrhea, unspecified: Secondary | ICD-10-CM | POA: Diagnosis not present

## 2018-10-16 ENCOUNTER — Other Ambulatory Visit: Payer: Self-pay | Admitting: Adult Medicine

## 2018-10-16 DIAGNOSIS — Z1231 Encounter for screening mammogram for malignant neoplasm of breast: Secondary | ICD-10-CM

## 2018-12-19 ENCOUNTER — Ambulatory Visit
Admission: RE | Admit: 2018-12-19 | Discharge: 2018-12-19 | Disposition: A | Discharge status: Home / Self Care | Payer: Medicare HMO | Source: Ambulatory Visit | Attending: Adult Medicine | Admitting: Adult Medicine

## 2018-12-19 ENCOUNTER — Other Ambulatory Visit: Payer: Self-pay

## 2018-12-19 DIAGNOSIS — Z1231 Encounter for screening mammogram for malignant neoplasm of breast: Secondary | ICD-10-CM

## 2018-12-23 ENCOUNTER — Other Ambulatory Visit: Payer: Self-pay | Admitting: Adult Medicine

## 2018-12-23 DIAGNOSIS — R928 Other abnormal and inconclusive findings on diagnostic imaging of breast: Secondary | ICD-10-CM

## 2018-12-25 ENCOUNTER — Other Ambulatory Visit: Payer: Self-pay | Admitting: Family Medicine

## 2018-12-26 ENCOUNTER — Other Ambulatory Visit: Payer: Self-pay

## 2018-12-26 ENCOUNTER — Ambulatory Visit: Payer: Medicare HMO

## 2018-12-26 ENCOUNTER — Ambulatory Visit
Admission: RE | Admit: 2018-12-26 | Discharge: 2018-12-26 | Disposition: A | Discharge status: Home / Self Care | Payer: Medicare HMO | Source: Ambulatory Visit | Attending: Adult Medicine | Admitting: Adult Medicine

## 2018-12-26 DIAGNOSIS — R928 Other abnormal and inconclusive findings on diagnostic imaging of breast: Secondary | ICD-10-CM

## 2019-11-25 ENCOUNTER — Other Ambulatory Visit: Payer: Self-pay | Admitting: Adult Medicine

## 2019-11-25 DIAGNOSIS — Z1231 Encounter for screening mammogram for malignant neoplasm of breast: Secondary | ICD-10-CM

## 2019-12-25 ENCOUNTER — Other Ambulatory Visit: Payer: Self-pay

## 2019-12-25 ENCOUNTER — Ambulatory Visit
Admission: RE | Admit: 2019-12-25 | Discharge: 2019-12-25 | Disposition: A | Discharge status: Home / Self Care | Payer: Medicare HMO | Source: Ambulatory Visit | Attending: Adult Medicine | Admitting: Adult Medicine

## 2019-12-25 DIAGNOSIS — Z1231 Encounter for screening mammogram for malignant neoplasm of breast: Secondary | ICD-10-CM

## 2020-03-31 ENCOUNTER — Other Ambulatory Visit: Payer: Self-pay | Admitting: Physical Medicine and Rehabilitation

## 2020-04-05 ENCOUNTER — Other Ambulatory Visit: Payer: Self-pay | Admitting: Physical Medicine and Rehabilitation

## 2020-04-05 DIAGNOSIS — M545 Low back pain, unspecified: Secondary | ICD-10-CM

## 2020-04-29 ENCOUNTER — Other Ambulatory Visit: Payer: Self-pay

## 2020-04-29 ENCOUNTER — Ambulatory Visit
Admission: RE | Admit: 2020-04-29 | Discharge: 2020-04-29 | Disposition: A | Discharge status: Home / Self Care | Payer: Medicare HMO | Source: Ambulatory Visit | Attending: Physical Medicine and Rehabilitation | Admitting: Physical Medicine and Rehabilitation

## 2020-04-29 DIAGNOSIS — M545 Low back pain, unspecified: Secondary | ICD-10-CM

## 2020-04-29 MED ORDER — GADOBENATE DIMEGLUMINE 529 MG/ML IV SOLN
13.0000 mL | Freq: Once | INTRAVENOUS | Status: AC | PRN
  Administered 2020-04-29: 13 mL via INTRAVENOUS

## 2020-05-13 IMAGING — [facility] DIGITAL DIAGNOSTIC UNILATERAL LEFT MAMMOGRAM WITH TOMO AND CAD
4 series · 5 of 12 positions shown · non-contrast
Comparison: Previous exam(s).
COMPARISON: Previous exam(s).

Addendum:
CLINICAL DATA: Screening recall for possible masses in the left
breast. The patient has history of bilateral reduction mammoplasty.

EXAM:
DIGITAL DIAGNOSTIC LEFT MAMMOGRAM WITH CAD AND TOMO

[L MLO synth-2D]
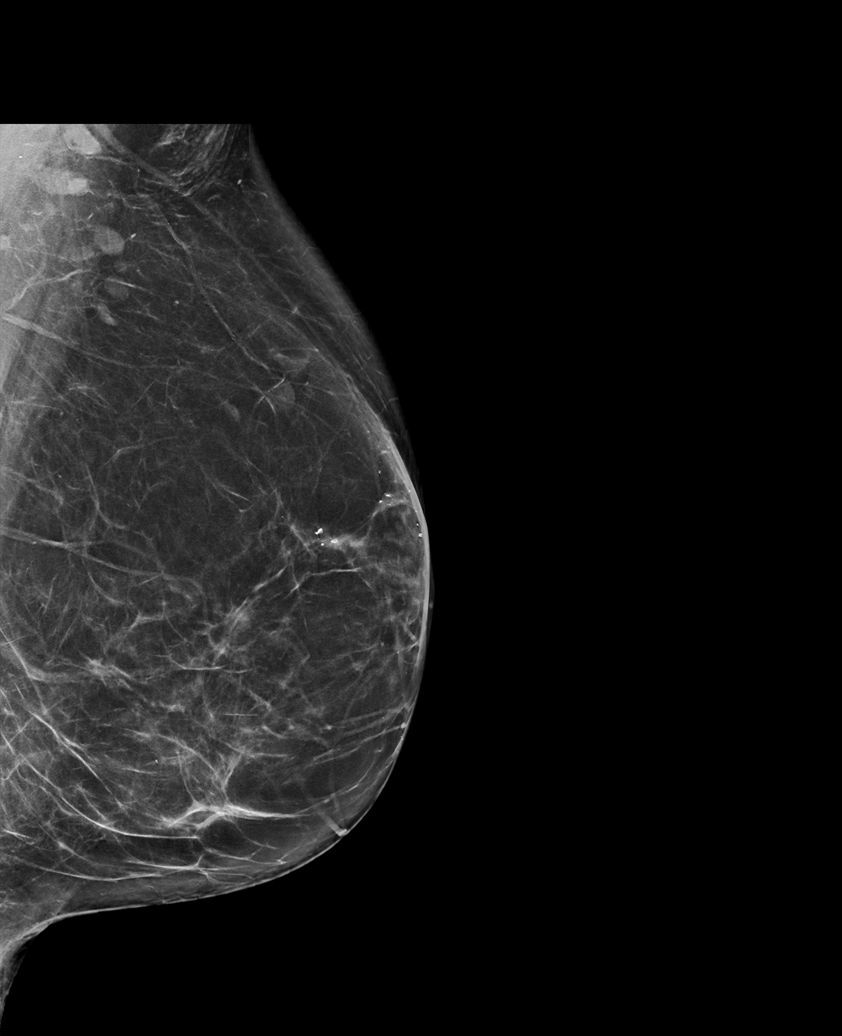

[L CC synth-2D]
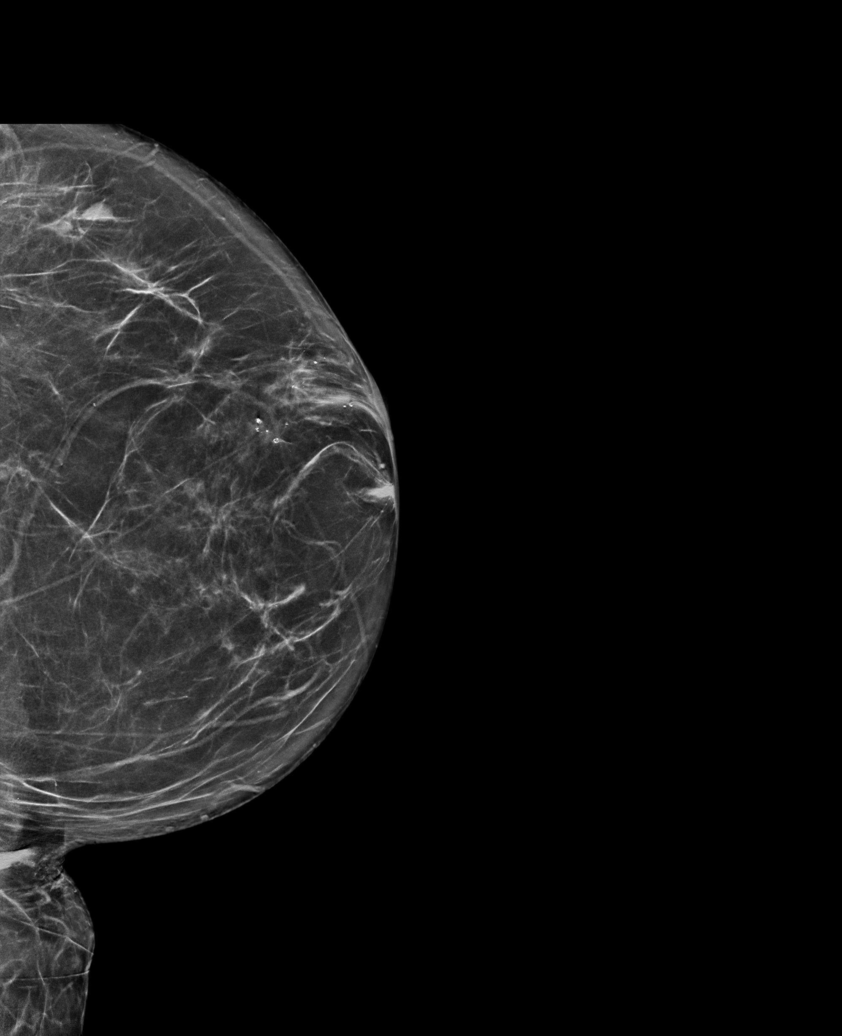

[L MLO tomo · 2 of 77 frames shown]
[frame 25/77]
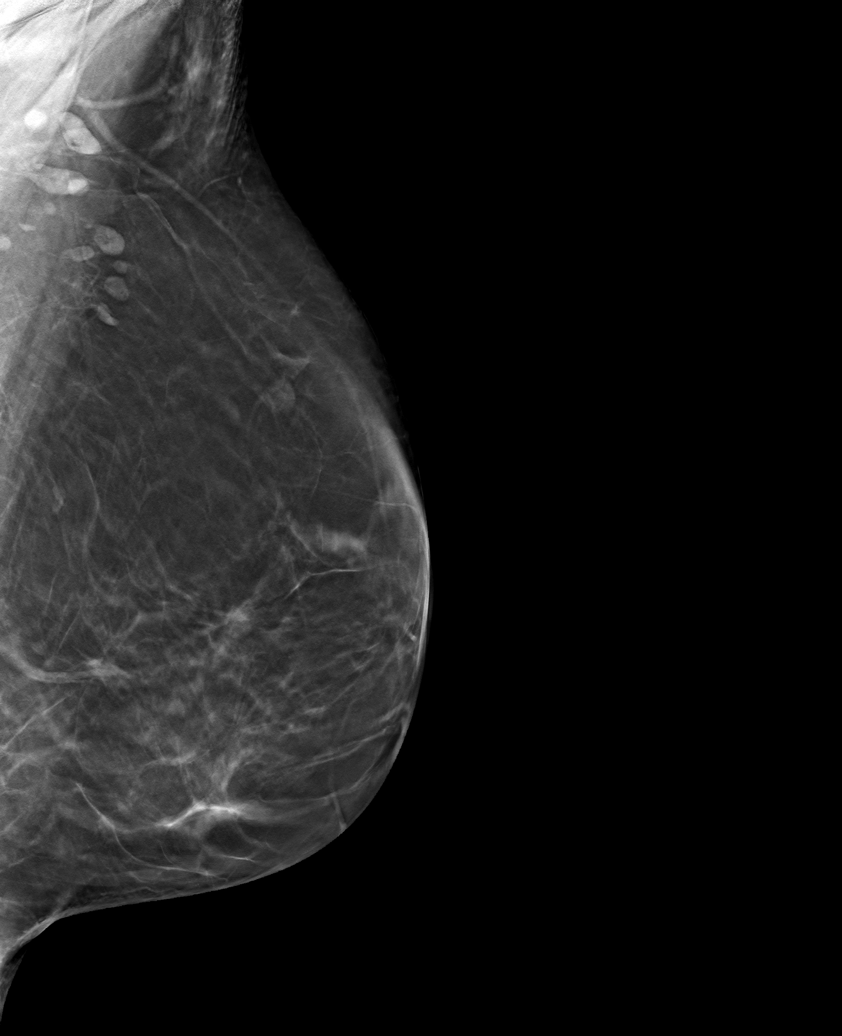
[frame 39/77]
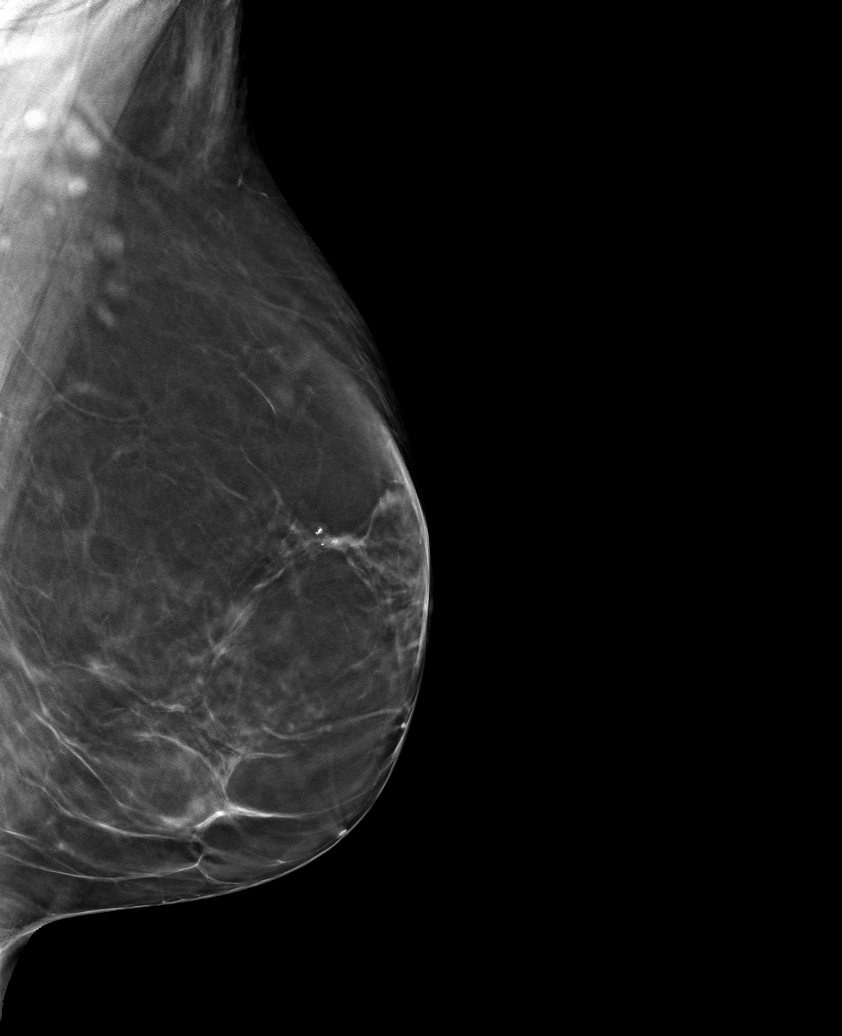

[L CC tomo · tomo slice 36/71.0]
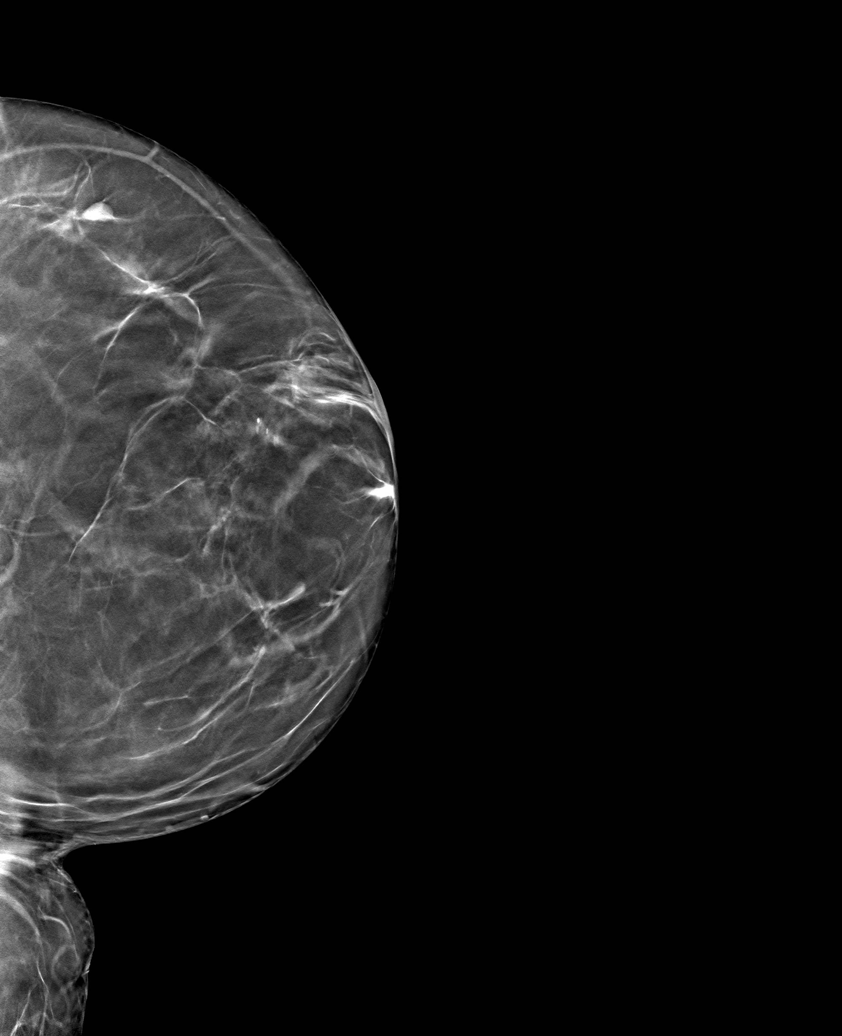

[5 of 12 positions shown; findings below may reference images not displayed]

ACR Breast Density Category b: There are scattered areas of
fibroglandular density.
FINDINGS: Tomosynthesis imaging through the lateral left breast demonstrates 2
low-density masses with indistinct margins in the upper-outer
quadrant, middle depth. There is a third slightly more dense oval
circumscribed mass in the lateral middle depth.

Mammographic images were processed with CAD.
IMPRESSION: There are 3 masses in the lateral to upper-outer quadrant of the
left breast which have a relatively benign appearance on this
mammogram. The mammographic appearance favors lymph nodes and/or
cysts, however the exam is incomplete as we need either prior films
or additional imaging.

RECOMMENDATION:
We have requested her outside films, and an addendum will be made to
this report when those are obtained. If we are unable to repeat
obtain older mammograms, then the patient will need to return for
targeted ultrasound to complete this study. The plan was discussed
and agreed upon with the patient.

I have discussed the findings and recommendations with the patient.
Results were also provided in writing at the conclusion of the
visit. If applicable, a reminder letter will be sent to the patient
regarding the next appointment.

BI-RADS CATEGORY  0: Incomplete. Need additional imaging evaluation
and/or prior mammograms for comparison.

ADDENDUM:
Prior mammograms from 2506 have become available. The mass is seen
in the lateral aspect of the left breast were present on the prior
exam, and appear unchanged. No further workup is necessary, and the
patient may return to annual screening mammography.

BI-RADS 2: Benign.

*** End of Addendum ***
ACR Breast Density Category b: There are scattered areas of
fibroglandular density.
FINDINGS: Tomosynthesis imaging through the lateral left breast demonstrates 2
low-density masses with indistinct margins in the upper-outer
quadrant, middle depth. There is a third slightly more dense oval
circumscribed mass in the lateral middle depth.

Mammographic images were processed with CAD.
IMPRESSION: There are 3 masses in the lateral to upper-outer quadrant of the
left breast which have a relatively benign appearance on this
mammogram. The mammographic appearance favors lymph nodes and/or
cysts, however the exam is incomplete as we need either prior films
or additional imaging.

RECOMMENDATION:
We have requested her outside films, and an addendum will be made to
this report when those are obtained. If we are unable to repeat
obtain older mammograms, then the patient will need to return for
targeted ultrasound to complete this study. The plan was discussed
and agreed upon with the patient.

I have discussed the findings and recommendations with the patient.
Results were also provided in writing at the conclusion of the
visit. If applicable, a reminder letter will be sent to the patient
regarding the next appointment.

BI-RADS CATEGORY  0: Incomplete. Need additional imaging evaluation
and/or prior mammograms for comparison.

## 2023-09-04 ENCOUNTER — Other Ambulatory Visit: Payer: Self-pay | Admitting: Rehabilitation

## 2023-09-04 DIAGNOSIS — M5416 Radiculopathy, lumbar region: Secondary | ICD-10-CM

## 2023-09-12 ENCOUNTER — Encounter: Payer: Self-pay | Admitting: Rehabilitation

## 2023-09-14 ENCOUNTER — Other Ambulatory Visit: Payer: Self-pay | Admitting: Rehabilitation

## 2023-09-14 ENCOUNTER — Ambulatory Visit
Admission: RE | Admit: 2023-09-14 | Discharge: 2023-09-14 | Disposition: A | Discharge status: Home / Self Care | Source: Ambulatory Visit | Attending: Rehabilitation | Admitting: Rehabilitation

## 2023-09-14 DIAGNOSIS — M5416 Radiculopathy, lumbar region: Secondary | ICD-10-CM

## 2023-09-27 ENCOUNTER — Ambulatory Visit
Admission: RE | Admit: 2023-09-27 | Discharge: 2023-09-27 | Disposition: A | Discharge status: Home / Self Care | Source: Ambulatory Visit | Attending: Rehabilitation | Admitting: Rehabilitation

## 2023-09-27 DIAGNOSIS — M5416 Radiculopathy, lumbar region: Secondary | ICD-10-CM
